# Patient Record
Sex: Female | Born: 2000
Health system: Southern US, Community
[De-identification: ages and names within clinical notes are randomized; demographics above are authoritative.]

## PROBLEM LIST (undated history)

## (undated) DIAGNOSIS — F419 Anxiety disorder, unspecified: Secondary | ICD-10-CM

## (undated) DIAGNOSIS — M940 Chondrocostal junction syndrome [Tietze]: Secondary | ICD-10-CM

## (undated) HISTORY — DX: Anxiety disorder, unspecified: F41.9

---
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
## 2021-02-08 ENCOUNTER — Telehealth (INDEPENDENT_AMBULATORY_CARE_PROVIDER_SITE_OTHER): Payer: 59 | Admitting: Physician Assistant

## 2021-02-08 ENCOUNTER — Encounter: Payer: Self-pay | Admitting: Physician Assistant

## 2021-02-08 VITALS — BP 133/93 | Ht 63.0 in | Wt 115.0 lb

## 2021-02-08 DIAGNOSIS — F411 Generalized anxiety disorder: Secondary | ICD-10-CM | POA: Insufficient documentation

## 2021-02-08 DIAGNOSIS — F419 Anxiety disorder, unspecified: Secondary | ICD-10-CM | POA: Insufficient documentation

## 2021-02-08 DIAGNOSIS — F430 Acute stress reaction: Secondary | ICD-10-CM

## 2021-02-08 DIAGNOSIS — F410 Panic disorder [episodic paroxysmal anxiety] without agoraphobia: Secondary | ICD-10-CM | POA: Diagnosis not present

## 2021-02-08 DIAGNOSIS — F22 Delusional disorders: Secondary | ICD-10-CM | POA: Insufficient documentation

## 2021-02-08 DIAGNOSIS — F422 Mixed obsessional thoughts and acts: Secondary | ICD-10-CM | POA: Diagnosis not present

## 2021-02-08 MED ORDER — LORAZEPAM 0.5 MG PO TABS: 0.5000 mg | ORAL_TABLET | Freq: Two times a day (BID) | ORAL | 0 refills | Status: DC | PRN

## 2021-02-08 MED ORDER — LORAZEPAM 0.5 MG PO TABS
0.5000 mg | ORAL_TABLET | Freq: Two times a day (BID) | ORAL | 0 refills | Status: DC | PRN
Start: 2021-02-08 — End: 2021-02-08

## 2021-02-08 MED ORDER — HYDROXYZINE HCL 25 MG PO TABS: ORAL_TABLET | ORAL | 0 refills | Status: DC

## 2021-02-08 MED ORDER — BUSPIRONE HCL 5 MG PO TABS: 5.0000 mg | ORAL_TABLET | Freq: Three times a day (TID) | ORAL | 0 refills | Status: DC

## 2021-02-08 NOTE — Progress Notes (Signed)
Anxiety/OCD - has symptoms but not diagnosed  Not hardly sleeping since last Tuesday, couple hours at a time First year of college, was able to go to school last week  Sunday - slept okay  Monday - pacing the floor, exhausted, obsessive thoughts, shaking Went to ER - gave ativan in IV, felt better (ER in [place]) Slept all night last night  Woke up this AM, feeling the same  Not coherent enough to perform mood screen  154/92 BP in ER last night, after calming down 124/80

## 2021-02-08 NOTE — Progress Notes (Signed)
Patient ID: Nicole Park, female   DOB: 06-03-2001, 20 y.o.   MRN: [id number] .Marland KitchenVirtual Visit via Video Note  I connected with Nicole Park on 02/08/21 at  9:50 AM EST by a video enabled telemedicine application and verified that I am speaking with the correct person using two identifiers.  Location: Patient: home Provider: clinic  .Marland KitchenParticipating in visit:  Patient: Nicole Park  Patient Mother and Father Provider: Tandy Gaw PA-C   I discussed the limitations of evaluation and management by telemedicine and the availability of in person appointments. The patient expressed understanding and agreed to proceed.  History of Present Illness: Patient is a 20 year old female who presents to the clinic via video with mother and father.  Her mother does most of the talking.  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  She has never been diagnosed with anxiety or OCD.  She has never been on any medication.  Recently the whole family had Covid and there was some isolation that took place that seem to aggravate the patient.  Since mid January and COVID she has been a little bit more anxious and on edge.  That has continued to worsen through her first year of college at [employer].  She has really high standards and will not make anything less than A.  Last Tuesday symptoms worsened even more of anxiety and she started to not sleep but a couple hours at a time.  She was able to go to school once we can complete her classes but it was difficult.  She had a fairly good weekend and Sunday was one of her better days where she was able to sleep and feel better.  Monday she woke up and she was extremely anxious.  She was pacing the floor, not able to sleep, exhausted, having obsessive thoughts that were irrational, feeling jittery.  Her mother reports that she was twisting her hair and seen the same things over and over like "I got to that, then that" " I can't  be like this".  Her mother took her to the ER in [place] where they gave her an oral Vistaril with no benefit.  The ended up giving her IV Ativan and that was the first rest she got.  She was able to sleep last night but she woke up feeling the same as yesterday but not quite as bad.  Her blood pressure in the ER last night was 154/92 but after calming down was 124/80.  Patient denies any chest pain, shortness of breath, palpitations.   .. Active Ambulatory Problems    Diagnosis Date Noted  . Neuropathy, thoracic (radicular) 07/09/2015  . Knee MCL sprain 06/10/2018  . Abdominal pain, right lower quadrant 07/15/2018  . Injury of thumb, left 12/16/2018  . Fracture of ankle, medial malleolus, right, closed 01/06/2019  . Dysuria 03/23/2020  . Panic attack due to exceptional stress 02/08/2021  . Anxiety 02/08/2021  . Paranoia (HCC) 02/08/2021  . Mixed obsessional thoughts and acts 02/08/2021   Resolved Ambulatory Problems    Diagnosis Date Noted  . No Resolved Ambulatory Problems   Past Medical History:  Diagnosis Date  . Slipped rib syndrome    Reviewed med, allergy, problem list.     Observations/Objective: Pt appears in distress She is pacing around the room and twirling her hair She looks like she is in panic  .Marland Kitchen Today's Vitals   02/08/21 0936  BP: (!) 133/93  Weight: 115 lb (52.2  kg)  Height: 5\' 3"  (1.6 m)   Body mass index is 20.37 kg/m.  Pt states she cannot do PHQ/GAD questionnaire because she cannot focus on the questions.    Assessment and Plan: Marland KitchenTaiz was seen today for anxiety.  Diagnoses and all orders for this visit:  Panic attack due to exceptional stress -     busPIRone (BUSPAR) 5 MG tablet; Take 1 tablet (5 mg total) by mouth 3 (three) times daily. -     hydrOXYzine (ATARAX/VISTARIL) 25 MG tablet; Take 1-2 tablets as needed up to three times day for panic attack. -     LORazepam (ATIVAN) 0.5 MG tablet; Take 1 tablet (0.5 mg total) by mouth every 12  (twelve) hours as needed for anxiety. No more than twice a day only as needed.  Anxiety -     busPIRone (BUSPAR) 5 MG tablet; Take 1 tablet (5 mg total) by mouth 3 (three) times daily. -     hydrOXYzine (ATARAX/VISTARIL) 25 MG tablet; Take 1-2 tablets as needed up to three times day for panic attack. -     LORazepam (ATIVAN) 0.5 MG tablet; Take 1 tablet (0.5 mg total) by mouth every 12 (twelve) hours as needed for anxiety. No more than twice a day only as needed.  Paranoia (HCC) -     busPIRone (BUSPAR) 5 MG tablet; Take 1 tablet (5 mg total) by mouth 3 (three) times daily. -     hydrOXYzine (ATARAX/VISTARIL) 25 MG tablet; Take 1-2 tablets as needed up to three times day for panic attack. -     LORazepam (ATIVAN) 0.5 MG tablet; Take 1 tablet (0.5 mg total) by mouth every 12 (twelve) hours as needed for anxiety. No more than twice a day only as needed.  Mixed obsessional thoughts and acts -     busPIRone (BUSPAR) 5 MG tablet; Take 1 tablet (5 mg total) by mouth 3 (three) times daily. -     hydrOXYzine (ATARAX/VISTARIL) 25 MG tablet; Take 1-2 tablets as needed up to three times day for panic attack. -     LORazepam (ATIVAN) 0.5 MG tablet; Take 1 tablet (0.5 mg total) by mouth every 12 (twelve) hours as needed for anxiety. No more than twice a day only as needed.  Other orders -     Discontinue: LORazepam (ATIVAN) 0.5 MG tablet; Take 1 tablet (0.5 mg total) by mouth every 12 (twelve) hours as needed for anxiety. No more than twice a day only as needed.   Patient was unable to complete the PHQ-9 and GAD-7 because she was not able to focus on the questions.  She does appear to be in acute panic.  I am not able to review her ED notes from Monday night.  [facility] is not in epic.  Unclear what the exact stressor is however patient has been acutely worsening since her whole family had Covid in January as well as this being her first year of college at [employer].  She has never been on medication  before.  Last night she was given Ativan and that seems to be the only thing that gave her any relief.  Discussed I would give her some Ativan however wanted her to know how this is not permanent treatment.  We need to find things that can control her anxiety other than benzodiazepines.  I do not think patient can focus on school at this point.  Wrote her out of school for 1 week.  I did add BuSpar  3 times a day and Atarax as needed.  Discussed to try those 2 things first and then Ativan if nothing else works.  Mother is trying to get patient into some counseling which I think is an excellent idea.  Discussed daily walking, deep breathing, meditation and yoga.  Patient denied any hallucinations, suicidal thoughts, thoughts of self-harm or harming others.  Follow up in 1 week.    Follow Up Instructions:    I discussed the assessment and treatment plan with the patient. The patient was provided an opportunity to ask questions and all were answered. The patient agreed with the plan and demonstrated an understanding of the instructions.   The patient was advised to call back or seek an in-person evaluation if the symptoms worsen or if the condition fails to improve as anticipated.  Spent 30 minutes with patient and family discussing symptoms, plans, medication.    Tandy Gaw, PA-C

## 2021-02-09 ENCOUNTER — Encounter: Payer: Self-pay | Admitting: Physician Assistant

## 2021-02-09 DIAGNOSIS — F22 Delusional disorders: Secondary | ICD-10-CM

## 2021-02-09 DIAGNOSIS — F410 Panic disorder [episodic paroxysmal anxiety] without agoraphobia: Secondary | ICD-10-CM

## 2021-02-09 DIAGNOSIS — F419 Anxiety disorder, unspecified: Secondary | ICD-10-CM

## 2021-02-10 ENCOUNTER — Encounter: Payer: Self-pay | Admitting: Physician Assistant

## 2021-02-10 DIAGNOSIS — F410 Panic disorder [episodic paroxysmal anxiety] without agoraphobia: Secondary | ICD-10-CM

## 2021-02-11 NOTE — Telephone Encounter (Signed)
FYI referral

## 2021-02-11 NOTE — Telephone Encounter (Signed)
Ok for note: please allow student to withdrawal from classes this semester due to medical reasons.

## 2021-02-14 NOTE — Telephone Encounter (Signed)
She was given Ativan, not sure what kind of sedative she is wanting, I'm sure this will need to wait til appt tomorrow but she also left a vm.

## 2021-02-15 ENCOUNTER — Encounter: Payer: Self-pay | Admitting: Physician Assistant

## 2021-02-15 ENCOUNTER — Telehealth: Payer: 59 | Admitting: Physician Assistant

## 2021-02-15 VITALS — BP 117/80 | HR 102 | Ht 63.0 in | Wt 114.0 lb

## 2021-02-15 DIAGNOSIS — F410 Panic disorder [episodic paroxysmal anxiety] without agoraphobia: Secondary | ICD-10-CM | POA: Diagnosis not present

## 2021-02-15 DIAGNOSIS — F22 Delusional disorders: Secondary | ICD-10-CM | POA: Diagnosis not present

## 2021-02-15 DIAGNOSIS — F422 Mixed obsessional thoughts and acts: Secondary | ICD-10-CM | POA: Diagnosis not present

## 2021-02-15 DIAGNOSIS — F430 Acute stress reaction: Secondary | ICD-10-CM

## 2021-02-15 DIAGNOSIS — F419 Anxiety disorder, unspecified: Secondary | ICD-10-CM | POA: Diagnosis not present

## 2021-02-15 MED ORDER — QUETIAPINE FUMARATE 50 MG PO TABS: ORAL_TABLET | ORAL | 0 refills | Status: DC

## 2021-02-15 NOTE — Telephone Encounter (Signed)
Thank yall for working with her Lesly Rubenstein squared, probably needs an SSRI on board, and potentially increase in Seroquel, I am happy to see her as well if there are any delays in getting her in with psychiatry.

## 2021-02-15 NOTE — Progress Notes (Signed)
Patient refused mood screen Not sleeping They increased Buspar to 2 tablets TID

## 2021-02-15 NOTE — Progress Notes (Signed)
Patient ID: Nicole Park, female   DOB: 08-24-2001, 20 y.o.   MRN: [id number] .Marland KitchenVirtual Visit via Video Note  I connected with Nicole Park on 02/15/21 at  8:50 AM EST by a video enabled telemedicine application and verified that I am speaking with the correct person using two identifiers.  Location: Patient: home Provider: clinic  .Marland KitchenParticipating in visit:  Patient: Nicole Park Patient mother and patient father who give most of the history. Provider: Tandy Gaw PA-C   I discussed the limitations of evaluation and management by telemedicine and the availability of in person appointments. The patient expressed understanding and agreed to proceed.  History of Present Illness: Patient is a 20 year old female with no diagnosed mental health history who presents to the clinic 1 week following an mental health crisis where she went to the emergency room and [facility] for anxiety, panic, compulsive thoughts, twitching.  We have not been able to locate those records since they are not on care everywhere.  Last week she was started on Ativan, hydroxyzine, BuSpar.  She had her first counseling session on Monday.  She is scheduled with a psychiatrist next Monday.  She has had little to no benefit in treatment.  The Ativan does help her the most and allows her to rest.  She did increase her BuSpar to 10 mg 3 times a day.  She denies any suicidal or homicidal thoughts.  She is having very obsessive sexual thoughts.  Mother and father support these or not like anything she ever thought about or was like.  She has this urge to touch herself in the vaginal area.  She does not understand why she continually thinks about this.  She tries not to make eye contact with people because the sexual urges and thoughts are so intrusive.  Patient is not sleeping except if she takes Ativan. She is not seeing things but her voices are so loud she feels like she hears her voice.   Mother and father support patient has always had  perfectionistic and compulsive behavior but mostly directed towards her schoolwork and behavior.  This is the first time she has had any behavior that she could not control.  .. Active Ambulatory Problems    Diagnosis Date Noted  . Neuropathy, thoracic (radicular) 07/09/2015  . Knee MCL sprain 06/10/2018  . Abdominal pain, right lower quadrant 07/15/2018  . Injury of thumb, left 12/16/2018  . Fracture of ankle, medial malleolus, right, closed 01/06/2019  . Dysuria 03/23/2020  . Panic attack due to exceptional stress 02/08/2021  . Anxiety 02/08/2021  . Paranoia (HCC) 02/08/2021  . Mixed obsessional thoughts and acts 02/08/2021   Resolved Ambulatory Problems    Diagnosis Date Noted  . No Resolved Ambulatory Problems   Past Medical History:  Diagnosis Date  . Slipped rib syndrome    Reviewed med, allergy, problem list.      Observations/Objective: Pt is avoidant with eyes.  She is jerky and constantly clicking this toy she has in her hand.  She talks so low you cannot understand her and mother has to interpret.  She displays flat affect.   Declines PHQ/MDQ/GAD.   Assessment and Plan: Marland KitchenMarland KitchenChaquana was seen today for mental health problem.  Diagnoses and all orders for this visit:  Paranoia (HCC) -     QUEtiapine (SEROQUEL) 50 MG tablet; Take one tablet at bedtime for 1 days then increase to one tablet at bedtime and 1/2 tablet in am and 1/2 tablet at lunch.  Mixed obsessional thoughts  and acts -     QUEtiapine (SEROQUEL) 50 MG tablet; Take one tablet at bedtime for 1 days then increase to one tablet at bedtime and 1/2 tablet in am and 1/2 tablet at lunch.  Panic attack due to exceptional stress -     QUEtiapine (SEROQUEL) 50 MG tablet; Take one tablet at bedtime for 1 days then increase to one tablet at bedtime and 1/2 tablet in am and 1/2 tablet at lunch.  Anxiety -     QUEtiapine (SEROQUEL) 50 MG tablet; Take one tablet at bedtime for 1 days then increase to one tablet at  bedtime and 1/2 tablet in am and 1/2 tablet at lunch.   Unclear etiology of sudden episode of paranoid and compulsive thoughts. Continue with counseling. Seeing pyshiatrist on Monday. Start seroquel 50mg  at bedtime and 25mg  at breakfast and lunch. Continue with:  buspar 10mg  TID. Ativan up to 1mg  up to BID as needed.  Stop hydroxyzine.  Follow up after [facility] with PCP for update.   Patient does not appear to be at risk of physical harm to herself or others.  Unclear etiology of mental health crisis. She has withdrawn from classes. The family did have covid a few weeks ago but if patient had covid she had little to no symptoms. She denies any illegal drug use. No family hx of mental disorders other than anxiety.    Follow Up Instructions:    I discussed the assessment and treatment plan with the patient. The patient was provided an opportunity to ask questions and all were answered. The patient agreed with the plan and demonstrated an understanding of the instructions.   The patient was advised to call back or seek an in-person evaluation if the symptoms worsen or if the condition fails to improve as anticipated.   Spent 40 minutes discussion plan with mother, father, patient.   , PA-C

## 2021-02-15 NOTE — Assessment & Plan Note (Signed)
Noted increasing anxiety and panic, currently on BuSpar, lorazepam, Seroquel. She likely will need further evaluation for bipolar disorder. Sounds like she has an appointment with psychiatry, I had be happy to see her as well, I certainly think she needs an SSRI on board as well as Seroquel rather than just BuSpar for now.

## 2021-02-15 NOTE — Telephone Encounter (Signed)
Your welcome! I told her to follow up with you after [facility] appt on Monday to get you up to date. I just started Seroquel today because it will give her more immediate help and they were all together hesitant on starting too many medications. I agree likely needs SSRI and I was think more sertraline due to her compulsive/perfectionist history.

## 2021-02-16 ENCOUNTER — Encounter: Payer: Self-pay | Admitting: Physician Assistant

## 2021-04-16 ENCOUNTER — Other Ambulatory Visit: Payer: Self-pay | Admitting: Physician Assistant

## 2021-04-16 DIAGNOSIS — F22 Delusional disorders: Secondary | ICD-10-CM

## 2021-04-16 DIAGNOSIS — F430 Acute stress reaction: Secondary | ICD-10-CM

## 2021-04-16 DIAGNOSIS — F422 Mixed obsessional thoughts and acts: Secondary | ICD-10-CM

## 2021-04-16 DIAGNOSIS — F419 Anxiety disorder, unspecified: Secondary | ICD-10-CM

## 2021-04-19 NOTE — Telephone Encounter (Signed)
Please advise, patient was following up with [facility].

## 2021-04-19 NOTE — Telephone Encounter (Signed)
Defer to [facility] and what they are prescribing. They may have changed it.

## 2021-05-11 ENCOUNTER — Other Ambulatory Visit: Payer: Self-pay | Admitting: Physician Assistant

## 2021-05-11 DIAGNOSIS — F22 Delusional disorders: Secondary | ICD-10-CM

## 2021-05-11 DIAGNOSIS — F430 Acute stress reaction: Secondary | ICD-10-CM

## 2021-05-11 DIAGNOSIS — F410 Panic disorder [episodic paroxysmal anxiety] without agoraphobia: Secondary | ICD-10-CM

## 2021-05-11 DIAGNOSIS — F422 Mixed obsessional thoughts and acts: Secondary | ICD-10-CM

## 2021-05-11 DIAGNOSIS — F419 Anxiety disorder, unspecified: Secondary | ICD-10-CM

## 2022-02-10 ENCOUNTER — Other Ambulatory Visit: Payer: Self-pay

## 2022-02-10 ENCOUNTER — Ambulatory Visit (INDEPENDENT_AMBULATORY_CARE_PROVIDER_SITE_OTHER): Payer: 59

## 2022-02-10 ENCOUNTER — Ambulatory Visit: Payer: 59 | Admitting: Sports Medicine

## 2022-02-10 DIAGNOSIS — R053 Chronic cough: Secondary | ICD-10-CM | POA: Diagnosis not present

## 2022-02-10 DIAGNOSIS — R058 Other specified cough: Secondary | ICD-10-CM

## 2022-02-10 MED ORDER — ALBUTEROL SULFATE HFA 108 (90 BASE) MCG/ACT IN AERS: INHALATION_SPRAY | RESPIRATORY_TRACT | 11 refills | Status: DC

## 2022-02-10 MED ORDER — HYDROCODONE BIT-HOMATROP MBR 5-1.5 MG/5ML PO SOLN: 5.0000 mL | Freq: Three times a day (TID) | ORAL | 0 refills | Status: DC | PRN

## 2022-02-10 MED ORDER — BENZONATATE 200 MG PO CAPS: 200.0000 mg | ORAL_CAPSULE | Freq: Three times a day (TID) | ORAL | 0 refills | Status: DC | PRN

## 2022-02-10 NOTE — Progress Notes (Signed)
? ? ?  Procedures performed today:   ? ?None. ? ?Independent interpretation of notes and tests performed by another provider:  ? ?None. ? ?Brief History, Exam, Impression, and Recommendations:   ? ?Post-viral cough syndrome ?This is a pleasant, previously healthy 21 year old female, she has had about a 4-week history of a minimally productive cough, treated with antibiotics about a week and a half ago, azithromycin. ?Unfortunately has a lingering cough that occurs when exercising, as well as when laying flat at night. ?She feels well overall. ?Exam is benign, lungs are clear. ?This is likely a postviral cough syndrome, we discussed the pathophysiology, adding albuterol for use before exercise, Tessalon Perles and Hycodan. ?Chest x-ray to complete the work-up. ?Return to see me in 4 weeks as needed. ? ? ? ?___________________________________________ ?Gwen Her. Dianah Field, M.D., ABFM., CAQSM. ?Primary Care and Sports Medicine ?[place] ? ?Adjunct Instructor of Family Medicine  ?[employer] ?

## 2022-02-10 NOTE — Assessment & Plan Note (Signed)
This is a pleasant, previously healthy 21 year old female, she has had about a 4-week history of a minimally productive cough, treated with antibiotics about a week and a half ago, azithromycin. ?Unfortunately has a lingering cough that occurs when exercising, as well as when laying flat at night. ?She feels well overall. ?Exam is benign, lungs are clear. ?This is likely a postviral cough syndrome, we discussed the pathophysiology, adding albuterol for use before exercise, Tessalon Perles and Hycodan. ?Chest x-ray to complete the work-up. ?Return to see me in 4 weeks as needed. ?

## 2022-03-10 ENCOUNTER — Ambulatory Visit: Payer: 59 | Admitting: Sports Medicine

## 2024-05-21 ENCOUNTER — Encounter: Payer: Self-pay | Admitting: Sports Medicine

## 2024-05-21 ENCOUNTER — Ambulatory Visit: Admitting: Sports Medicine

## 2024-05-21 VITALS — BP 131/88 | HR 73 | Temp 97.8°F | Wt 146.0 lb

## 2024-05-21 DIAGNOSIS — R1011 Right upper quadrant pain: Secondary | ICD-10-CM | POA: Diagnosis not present

## 2024-05-21 MED ORDER — PANTOPRAZOLE SODIUM 40 MG PO TBEC: 40.0000 mg | DELAYED_RELEASE_TABLET | Freq: Every day | ORAL | 3 refills | Status: DC

## 2024-05-21 NOTE — Progress Notes (Signed)
    Procedures performed today:    None.  Independent interpretation of notes and tests performed by another provider:   None.  Brief History, Exam, Impression, and Recommendations:    Abdominal pain, right upper quadrant Very pleasant 23 year old female, she recalls going to the beach, she does not typically eat seafood but she ate some on her trip, soon after she developed abdominal pain, right upper quadrant, crampy, with watery diarrhea resulting in an episode of fecal incontinence, she also endorses some dark and tarry stools. She was seen in urgent care, CBC, CMP were normal, stool cultures did come back positive for enteropathogenic E. coli, enterohemorrhagic and enterotoxigenic E. coli were negative. All other stool cultures were negative. She has been prescribed antibiotics by the urgent care, she has not yet started them. She does continue to report pain in the right upper quadrant, on exam she does have some discomfort in this region, she has no guarding, rigidity, no rebound tenderness, she does have a positive Murphy sign. For this reason we will go and get an ultrasound of her right upper quadrant. We will also go ahead and start pantoprazole 40 mg daily, I like to see her back in 2 or 3 weeks for further evaluation, differential does include biliary colic, peptic ulcer disease, and of course the confirmed infectious colitis.    ____________________________________________ Nicole Park. Sandy Crumb, M.D., ABFM., CAQSM., AME. Primary Care and Sports Medicine [facility]  Adjunct Professor of [facility] Medicine  [employer]  Restaurant manager, fast food

## 2024-05-21 NOTE — Assessment & Plan Note (Signed)
 Very pleasant 23 year old female, she recalls going to the beach, she does not typically eat seafood but she ate some on her trip, soon after she developed abdominal pain, right upper quadrant, crampy, with watery diarrhea resulting in an episode of fecal incontinence, she also endorses some dark and tarry stools. She was seen in urgent care, CBC, CMP were normal, stool cultures did come back positive for enteropathogenic E. coli, enterohemorrhagic and enterotoxigenic E. coli were negative. All other stool cultures were negative. She has been prescribed antibiotics by the urgent care, she has not yet started them. She does continue to report pain in the right upper quadrant, on exam she does have some discomfort in this region, she has no guarding, rigidity, no rebound tenderness, she does have a positive Murphy sign. For this reason we will go and get an ultrasound of her right upper quadrant. We will also go ahead and start pantoprazole 40 mg daily, I like to see her back in 2 or 3 weeks for further evaluation, differential does include biliary colic, peptic ulcer disease, and of course the confirmed infectious colitis.

## 2024-05-24 ENCOUNTER — Ambulatory Visit (HOSPITAL_BASED_OUTPATIENT_CLINIC_OR_DEPARTMENT_OTHER)
Admission: RE | Admit: 2024-05-24 | Discharge: 2024-05-24 | Disposition: A | Discharge status: Home / Self Care | Source: Ambulatory Visit | Attending: Sports Medicine | Admitting: Sports Medicine

## 2024-05-24 DIAGNOSIS — R1011 Right upper quadrant pain: Secondary | ICD-10-CM | POA: Insufficient documentation

## 2024-05-26 ENCOUNTER — Ambulatory Visit: Payer: Self-pay | Admitting: Sports Medicine

## 2024-06-11 ENCOUNTER — Other Ambulatory Visit: Payer: Self-pay | Admitting: Sports Medicine

## 2024-06-11 ENCOUNTER — Telehealth: Payer: Self-pay

## 2024-06-11 ENCOUNTER — Ambulatory Visit (INDEPENDENT_AMBULATORY_CARE_PROVIDER_SITE_OTHER): Admitting: Sports Medicine

## 2024-06-11 ENCOUNTER — Other Ambulatory Visit (HOSPITAL_COMMUNITY): Payer: Self-pay

## 2024-06-11 VITALS — BP 130/78 | HR 82 | Ht 63.0 in | Wt 146.0 lb

## 2024-06-11 DIAGNOSIS — Z0000 Encounter for general adult medical examination without abnormal findings: Secondary | ICD-10-CM | POA: Diagnosis not present

## 2024-06-11 DIAGNOSIS — F411 Generalized anxiety disorder: Secondary | ICD-10-CM

## 2024-06-11 DIAGNOSIS — Z23 Encounter for immunization: Secondary | ICD-10-CM

## 2024-06-11 MED ORDER — SERTRALINE HCL 25 MG PO TABS: ORAL_TABLET | ORAL | 0 refills | Status: DC

## 2024-06-11 NOTE — Assessment & Plan Note (Addendum)
 Fasting annual physical. Tdap today. She would like to talk to her parents about HPV. Referral to [facility] for cervical cancer screening, she is 23 now. Routine labs today. Did have E. coli colitis, all symptoms have resolved.

## 2024-06-11 NOTE — Addendum Note (Signed)
 Addended by: OLEY ROBIN L on: 06/11/2024 11:02 AM   Modules accepted: Orders

## 2024-06-11 NOTE — Assessment & Plan Note (Signed)
 Doing really well on sertraline, care would like to try to come off of medication, I think this is appropriate as long as we have close follow-up with the understanding that we can restart it should symptoms return. She would like to do a very slow down taper, we will do 2 weeks on each dose until off of it and I would like to see her back in about 3 months for repeat PHQ/GAD. This will give her 6 weeks off of medication to adjust. She contracts for safety.

## 2024-06-11 NOTE — Telephone Encounter (Signed)
 Pt is tapering down off of 100mg  dose. With the 25mg  tabs, she can taper down through 75, 50, and 25mg  doses.

## 2024-06-11 NOTE — Telephone Encounter (Signed)
 If the order was changed to: Sertraline 50mg  - take 1 and 1/2 tab (75mg ) daily x14 days, 1 tab daily(50mg ) x14 days, 1/2 tab daily (25mg ) x14 #42  Per test claim it would be covered and the patient would not have to wait on a PA.

## 2024-06-11 NOTE — Progress Notes (Signed)
 Subjective:    CC: Annual Physical Exam  HPI:  This patient is here for their annual physical  I reviewed the past medical history, family history, social history, surgical history, and allergies today and no changes were needed.  Please see the problem list section below in epic for further details.  Past Medical History: Past Medical History:  Diagnosis Date   Anxiety    Slipped rib syndrome    Past Surgical History: No past surgical history on file. Social History: Social History   Socioeconomic History   Marital status: Single    Spouse name: Not on file   Number of children: Not on file   Years of education: Not on file   Highest education level: Associate degree: academic program  Occupational History   Not on file  Tobacco Use   Smoking status: Never   Smokeless tobacco: Never  Vaping Use   Vaping status: Never Used  Substance and Sexual Activity   Alcohol use: Yes    Alcohol/week: 1.0 standard drink of alcohol    Types: 1 Cans of beer per week   Drug use: No   Sexual activity: Never  Other Topics Concern   Not on file  Social History Narrative   Not on file   Social Drivers of Health   Financial Resource Strain: Low Risk  (06/11/2024)   Overall Financial Resource Strain (CARDIA)    Difficulty of Paying Living Expenses: Not hard at all  Food Insecurity: No Food Insecurity (06/11/2024)   Hunger Vital Sign    Worried About Running Out of Food in the Last Year: Never true    Ran Out of Food in the Last Year: Never true  Transportation Needs: No Transportation Needs (06/11/2024)   PRAPARE - Administrator, Civil Service (Medical): No    Lack of Transportation (Non-Medical): No  Physical Activity: Insufficiently Active (06/11/2024)   Exercise Vital Sign    Days of Exercise per Week: 3 days    Minutes of Exercise per Session: 20 min  Stress: Stress Concern Present (06/11/2024)   [employer] of Occupational Health - Occupational Stress  Questionnaire    Feeling of Stress: To some extent  Social Connections: Moderately Integrated (06/11/2024)   Social Connection and Isolation Panel    Frequency of Communication with Friends and Family: More than three times a week    Frequency of Social Gatherings with Friends and Family: More than three times a week    Attends Religious Services: More than 4 times per year    Active Member of [employer] or Organizations: Yes    Attends Engineer, structural: More than 4 times per year    Marital Status: Never married   Family History: Family History  Problem Relation Age of Onset   Migraines Mother    Anxiety disorder Mother    Anxiety disorder Maternal Grandmother    Depression Other    Allergies: No Known Allergies Medications: See med rec.  Review of Systems: No headache, visual changes, nausea, vomiting, diarrhea, constipation, dizziness, abdominal pain, skin rash, fevers, chills, night sweats, swollen lymph nodes, weight loss, chest pain, body aches, joint swelling, muscle aches, shortness of breath, mood changes, visual or auditory hallucinations.  Objective:    General: Well Developed, well nourished, and in no acute distress.  Neuro: Alert and oriented x3, extra-ocular muscles intact, sensation grossly intact. Cranial nerves II through XII are intact, motor, sensory, and coordinative functions are all intact. HEENT: Normocephalic, atraumatic, pupils  equal round reactive to light, neck supple, no masses, no lymphadenopathy, thyroid nonpalpable. Oropharynx, nasopharynx, external ear canals are unremarkable. Skin: Warm and dry, no rashes noted.  Cardiac: Regular rate and rhythm, no murmurs rubs or gallops.  Respiratory: Clear to auscultation bilaterally. Not using accessory muscles, speaking in full sentences.  Abdominal: Soft, nontender, nondistended, positive bowel sounds, no masses, no organomegaly.  Musculoskeletal: Shoulder, elbow, wrist, hip, knee, ankle stable, and  with full range of motion.  Impression and Recommendations:    The patient was counselled, risk factors were discussed, anticipatory guidance given.  Annual physical exam Fasting annual physical. Tdap today. She would like to talk to her parents about HPV. Referral to [facility] for cervical cancer screening, she is 23 now. Routine labs today. Did have E. coli colitis, all symptoms have resolved.   GAD (generalized anxiety disorder) Doing really well on sertraline, care would like to try to come off of medication, I think this is appropriate as long as we have close follow-up with the understanding that we can restart it should symptoms return. She would like to do a very slow down taper, we will do 2 weeks on each dose until off of it and I would like to see her back in about 3 months for repeat PHQ/GAD. This will give her 6 weeks off of medication to adjust. She contracts for safety.   ____________________________________________ Debby PARAS. Curtis, M.D., ABFM., CAQSM., AME. Primary Care and Sports Medicine [facility]  Adjunct Professor of [employer] [facility] Medicine  [employer]  Restaurant manager, fast food

## 2024-06-11 NOTE — Telephone Encounter (Signed)
 Pharmacy Patient Advocate Encounter   Received notification from RX Request Messages that prior authorization for Sertraline 25mg  tabs is required/requested.   Insurance verification completed.   The patient is insured through [facility] .   Per test claim: Use higher strength, 1 per day.   Could the order be changed to 50mg  tabs?   I tried a test claim for the 50mg  and received a paid claim.   Please advise

## 2024-06-12 ENCOUNTER — Ambulatory Visit: Payer: Self-pay | Admitting: Sports Medicine

## 2024-06-12 ENCOUNTER — Encounter: Payer: Self-pay | Admitting: Sports Medicine

## 2024-06-12 LAB — LIPID PANEL
Chol/HDL Ratio: 3.3 ratio (ref 0.0–4.4)
Cholesterol, Total: 192 mg/dL (ref 100–199)
HDL: 58 mg/dL (ref >39)
LDL Chol Calc (NIH): 125 mg/dL — ABNORMAL HIGH (ref 0–99)
Triglycerides: 50 mg/dL (ref 0–149)
VLDL Cholesterol Cal: 9 mg/dL (ref 5–40)

## 2024-06-12 LAB — COMPREHENSIVE METABOLIC PANEL WITH GFR
ALT: 39 IU/L — ABNORMAL HIGH (ref 0–32)
AST: 28 IU/L (ref 0–40)
Albumin: 4.4 g/dL (ref 4.0–5.0)
Alkaline Phosphatase: 40 IU/L — ABNORMAL LOW (ref 44–121)
BUN/Creatinine Ratio: 20 (ref 9–23)
BUN: 14 mg/dL (ref 6–20)
Bilirubin Total: 0.8 mg/dL (ref 0.0–1.2)
CO2: 20 mmol/L (ref 20–29)
Calcium: 9.7 mg/dL (ref 8.7–10.2)
Chloride: 103 mmol/L (ref 96–106)
Creatinine, Ser: 0.7 mg/dL (ref 0.57–1.00)
Globulin, Total: 2.8 g/dL (ref 1.5–4.5)
Glucose: 90 mg/dL (ref 70–99)
Potassium: 4.6 mmol/L (ref 3.5–5.2)
Sodium: 140 mmol/L (ref 134–144)
Total Protein: 7.2 g/dL (ref 6.0–8.5)
eGFR: 125 mL/min/{1.73_m2} (ref >59)

## 2024-06-12 LAB — CBC
Hematocrit: 46.5 % (ref 34.0–46.6)
Hemoglobin: 14.8 g/dL (ref 11.1–15.9)
MCH: 27.9 pg (ref 26.6–33.0)
MCHC: 31.8 g/dL (ref 31.5–35.7)
MCV: 88 fL (ref 79–97)
Platelets: 295 10*3/uL (ref 150–450)
RBC: 5.3 x10E6/uL — ABNORMAL HIGH (ref 3.77–5.28)
RDW: 12.9 % (ref 11.7–15.4)
WBC: 5.1 10*3/uL (ref 3.4–10.8)

## 2024-06-12 LAB — HEMOGLOBIN A1C
Est. average glucose Bld gHb Est-mCnc: 97 mg/dL
Hgb A1c MFr Bld: 5 % (ref 4.8–5.6)

## 2024-06-12 LAB — TSH: TSH: 1.22 u[IU]/mL (ref 0.450–4.500)

## 2024-06-12 NOTE — Telephone Encounter (Signed)
 Should be quite cheap with a good Rx coupon

## 2024-06-16 NOTE — Telephone Encounter (Signed)
 Pt aware of the good rx cost and verbalized understanding.

## 2024-06-16 NOTE — Telephone Encounter (Signed)
 Spoke with patient regarding GoodRx. She verbalized understanding.

## 2024-06-27 ENCOUNTER — Encounter: Payer: Self-pay | Admitting: Sports Medicine

## 2024-06-27 ENCOUNTER — Other Ambulatory Visit: Payer: Self-pay

## 2024-07-02 ENCOUNTER — Telehealth: Payer: Self-pay

## 2024-07-02 ENCOUNTER — Other Ambulatory Visit (HOSPITAL_COMMUNITY): Payer: Self-pay

## 2024-07-02 DIAGNOSIS — F411 Generalized anxiety disorder: Secondary | ICD-10-CM

## 2024-07-02 NOTE — Telephone Encounter (Signed)
 Pharmacy Patient Advocate Encounter   Received notification from Patient Pharmacy that prior authorization for Sertraline  25mg  tabs is required/requested.   Insurance verification completed.   The patient is insured through [facility] .   Per test claim: The patient's insurance will only pay for 1 tablet per day.   Per last telephone encounter, if the order was changed to the Sertraline  50mg  tabs, her insurance would cover it at a $0 copay. The medicine is scored so it would be easy to break in half.

## 2024-07-03 MED ORDER — SERTRALINE HCL 50 MG PO TABS: ORAL_TABLET | ORAL | 0 refills | Status: AC

## 2024-07-03 NOTE — Addendum Note (Signed)
 Addended by: CURTIS DEBBY PARAS on: 07/03/2024 06:58 AM   Modules accepted: Orders

## 2024-07-03 NOTE — Telephone Encounter (Signed)
 Attempted to contact patient regarding update. Unfortunately I was sent to voicemail. I have left a voice message requesting a call back to our office.

## 2024-07-03 NOTE — Telephone Encounter (Signed)
 I have sent it again with the new parameters for a 50 mg tablet.

## 2024-07-11 NOTE — Telephone Encounter (Signed)
 Patient picked up prescription on 07/04/2024

## 2024-07-24 ENCOUNTER — Ambulatory Visit: Admitting: Medical-Surgical

## 2024-08-05 ENCOUNTER — Ambulatory Visit: Admitting: Medical-Surgical

## 2024-08-12 ENCOUNTER — Encounter: Payer: Self-pay | Admitting: Sports Medicine

## 2024-08-19 ENCOUNTER — Ambulatory Visit: Admitting: Medical-Surgical

## 2024-09-11 ENCOUNTER — Ambulatory Visit: Admitting: Sports Medicine

## 2024-12-14 ENCOUNTER — Telehealth: Admitting: Nurse Practitioner

## 2024-12-14 DIAGNOSIS — B001 Herpesviral vesicular dermatitis: Secondary | ICD-10-CM | POA: Diagnosis not present

## 2024-12-14 MED ORDER — VALACYCLOVIR HCL 1 G PO TABS: 2000.0000 mg | ORAL_TABLET | Freq: Two times a day (BID) | ORAL | 0 refills | Status: AC

## 2024-12-14 NOTE — Progress Notes (Signed)
 You likely have viral pharyngitis. I can send something for the cold sores/fever blisters. Lymph nodes can be swollen from viral illnesses or infections. Your labs do not show any indication of a true bacterial infection.   I have sent valtrex  for the cold sores.   E visit for Flu like symptoms   We are sorry that you are not feeling well.  Here is how we plan to help! Based on what you have shared with me it looks like you may have flu-like symptoms that should be watched but do not seem to indicate anti-viral treatment.  Influenza or the flu is  an infection caused by a respiratory virus. The flu virus is highly contagious and persons who did not receive their yearly flu vaccination may catch the flu from close contact.  We have anti-viral medications to treat the viruses that cause this infection. They are not a cure and only shorten the course of the infection. These prescriptions are most effective when they are given within the first 2 days of flu symptoms. Antiviral medications are indicated if you have a high risk of complications from the flu. You should  also consider an antiviral medication if you are in close contact with someone who is at risk. These medications can help patients avoid complications from the flu but have side effects that you should know.   Possible side effects from Tamiflu or oseltamivir include nausea, vomiting, diarrhea, dizziness, headaches, eye redness, sleep problems or other respiratory symptoms. You should not take Tamiflu if you have an allergy to oseltamivir or any to the ingredients in Tamiflu.   If you have a sore or scratchy throat, use a saltwater gargle-  to  teaspoon of salt dissolved in a 4-ounce to 8-ounce glass of warm water.  Gargle the solution for approximately 15-30 seconds and then spit.  It is important not to swallow the solution.  You can also use throat lozenges/cough drops and Chloraseptic spray to help with throat pain or  discomfort.  Warm or cold liquids can also be helpful in relieving throat pain.  For headache, pain or general discomfort, you can use Ibuprofen  or Tylenol  as directed.   Some authorities believe that zinc sprays or the use of Echinacea may shorten the course of your symptoms.   You are to isolate at home until you have been fever-free for at least 24 hours without a fever-reducing medication, and symptoms have been steadily improving for 24 hours.  If you must be around other household members who do not have symptoms, you need to make sure that both you and the family members are masking consistently with a high-quality mask.  If you note any worsening of symptoms despite treatment, please seek an in-person evaluation ASAP. If you note any significant shortness of breath or any chest pain, please seek ED evaluation. Please do not delay care!  ANYONE WHO HAS FLU SYMPTOMS SHOULD: Stay home. The flu is highly contagious and going out or to work exposes others! Be sure to drink plenty of fluids. Water is fine as well as fruit juices, sodas and electrolyte beverages. You may want to stay away from caffeine or alcohol. If you are nauseated, try taking small sips of liquids. How do you know if you are getting enough fluid? Your urine should be a pale yellow or almost colorless. Get rest. Taking a steamy shower or using a humidifier may help nasal congestion and ease sore throat pain. Using a saline nasal spray works  much the same way. Cough drops, hard candies and sore throat lozenges may ease your cough. Line up a caregiver. Have someone check on you regularly.  GET HELP RIGHT AWAY IF: You cannot keep down liquids or your medications. You become short of breath Your fell like you are going to pass out or loose consciousness. Your symptoms persist after you have completed your treatment plan  MAKE SURE YOU  Understand these instructions. Will watch your condition. Will get help right away if  you are not doing well or get worse.  Your e-visit answers were reviewed by a board certified advanced clinical practitioner to complete your personal care plan.  Depending on the condition, your plan could have included both over the counter or prescription medications.  If there is a problem please reply  once you have received a response from your provider.  Your safety is important to us .  If you have drug allergies check your prescription carefully.    You can use MyChart to ask questions about todays visit, request a non-urgent call back, or ask for a work or school excuse for 24 hours related to this e-Visit. If it has been greater than 24 hours you will need to follow up with your provider, or enter a new e-Visit to address those concerns.  You will get an e-mail in the next two days asking about your experience.  I hope that your e-visit has been valuable and will speed your recovery. Thank you for using e-visits.   I have spent 5 minutes in review of e-visit questionnaire, review and updating patient chart, medical decision making and response to patient.   Batul Diego W Maryam Feely, NP

## 2024-12-15 ENCOUNTER — Ambulatory Visit: Payer: Self-pay

## 2024-12-15 NOTE — Telephone Encounter (Signed)
 Patient shows scheduled tomorrow 12/16/2024 at 8:10am .

## 2024-12-15 NOTE — Telephone Encounter (Signed)
 FYI Only or Action Required?: FYI only for provider: appointment scheduled on 1/6.  Patient was last seen in primary care on 06/11/2024 by Curtis Debby PARAS, MD.  Called Nurse Triage reporting Fever.  Symptoms began several days ago.  Interventions attempted: OTC medications: tylenol , ibuprofen .  Symptoms are: gradually worsening.  Triage Disposition: See Physician Within 24 Hours  Patient/caregiver understands and will follow disposition?: Yes   Copied from CRM [id number]. Topic: Clinical - Red Word Triage >> Dec 15, 2024 11:25 AM Mercer PEDLAR wrote: Red Word that prompted transfer to Nurse Triage: painful lymph nodes, cold sores, and fever of 100.4 on 12/14/23. Has been to urgent care but symptoms are worsening. Reason for Disposition  [1] Tender node in the neck AND [2] also has a sore throat AND [3] minimal/no runny nose or cough  Answer Assessment - Initial Assessment Questions Pt with swollen painful lymph nodes starting 1/2. She was seen in [facility]. Evisit for cold sores. Lymph pain is worsening severe stabbing pain. Strep/flu negative, mono negative- they sent off for epstein barr but hasn't returned. Denies white patches on tonsils but they are red and swollen. Pain from the outside lymph nodes. Still with low grade fevers. Denies cough, headache, congestion, CP, SOB, dizziness.   Alternating tylenol  and ibuprofen . Hot tea with honey.   Appt 1/6 to assess.   1. LOCATION: Where is the swollen node located? Is the matching node on the other side of the body also swollen?      Bilateral but left worse than right  2. SIZE: How big is the node? (e.g., inches or centimeters; or compared to common objects such as pea, bean, marble, golf ball)      Visably Noticeable to family 3. ONSET: When did the swelling start?      Friday 1/2 4. NECK NODES: Is there a sore throat, runny nose or other symptoms of a cold?      Fever, fatigue, and cold sores ( has never had before) 5.  GROIN OR ARMPIT NODES: Is there a sore, scratch, cut or painful red area on that arm or leg?      denies 6. FEVER: Do you have a fever? If Yes, ask: What is it, how was it measured, and when did it start?      100.4 7. CAUSE: What do you think is causing the swollen lymph nodes?     Unsure  8. OTHER SYMPTOMS: Do you have any other symptoms? (e.g., node is tender to touch, skin redness over node, weight changes)     Tender to touch, painful to swallow 9. PREGNANCY: Is there any chance you are pregnant? When was your last menstrual period?     denies  Protocols used: Lymph Nodes - Swollen-A-AH

## 2024-12-16 ENCOUNTER — Ambulatory Visit: Admitting: Physician Assistant

## 2024-12-16 ENCOUNTER — Encounter: Payer: Self-pay | Admitting: Physician Assistant

## 2024-12-16 VITALS — BP 122/81 | Temp 98.4°F | Ht 63.0 in | Wt 155.0 lb

## 2024-12-16 DIAGNOSIS — J039 Acute tonsillitis, unspecified: Secondary | ICD-10-CM

## 2024-12-16 DIAGNOSIS — R6889 Other general symptoms and signs: Secondary | ICD-10-CM

## 2024-12-16 DIAGNOSIS — J029 Acute pharyngitis, unspecified: Secondary | ICD-10-CM

## 2024-12-16 DIAGNOSIS — R590 Localized enlarged lymph nodes: Secondary | ICD-10-CM

## 2024-12-16 LAB — POCT RAPID STREP A (OFFICE): Rapid Strep A Screen: NEGATIVE

## 2024-12-16 LAB — POC SOFIA 2 FLU + SARS ANTIGEN FIA
Influenza A, POC: NEGATIVE
Influenza B, POC: NEGATIVE
SARS Coronavirus 2 Ag: NEGATIVE

## 2024-12-16 MED ORDER — PREDNISONE 20 MG PO TABS: 20.0000 mg | ORAL_TABLET | Freq: Two times a day (BID) | ORAL | 0 refills | Status: AC

## 2024-12-16 MED ORDER — LIDOCAINE VISCOUS HCL 2 % MT SOLN: 15.0000 mL | OROMUCOSAL | 0 refills | Status: AC | PRN

## 2024-12-16 NOTE — Patient Instructions (Signed)
 Get labs for mono today.  Start prednisone  and lidocaine  mouth wash Continue tylenol  every 5-6 hours for pain relief.  Call with any changing or worsening symptoms.   Tonsillitis  Tonsillitis is an infection of the throat. Tonsils are tissues in the back of your throat. This infection causes the tonsils to become red, tender, and swollen. What are the causes? Tonsillitis is caused by germs (bacteria or a virus). This condition can also occur when pieces of food and bacteria build up around the tonsils. Tonsillitis that is caused by germs can spread from person to person. What are the signs or symptoms? A sore throat. Trouble swallowing. White patches on the tonsils. Swollen tonsils. Fever. Headache. Tiredness. Not feeling hungry. Snoring during sleep when you did not snore before. Foul-smelling, yellowish-white pieces of material that you cough up or spit out. These can cause bad breath. How is this treated? Medicines. These can be given to treat pain, swelling, or fever. They can also be given to kill bacteria. Surgery to take out the tonsils. This is done if you have very bad infections that do not go away. Follow these instructions at home: Medicines Take over-the-counter and prescription medicines only as told by your doctor. If you were prescribed an antibiotic medicine, take it as told by your doctor. Do not stop taking the antibiotic even if you start to feel better. Eating and drinking Drink enough fluid to keep your pee (urine) pale yellow. While your throat is sore, eat soft or liquid foods, such as: Soup. Sherbet. Soft, warm cereals, such as oatmeal or hot wheat cereal. Drink warm fluids. Eat frozen ice pops. General instructions Rest as much as you can, and get plenty of sleep. Rinse your mouth often with salt water. To make salt water, dissolve -1 tsp (3-6 g) of salt in 1 cup (237 mL) of warm water. Do not swallow the salt water. Wash your hands often with soap  and water for at least 20 seconds. If there is no soap and water, use hand sanitizer. Do not share cups, bottles, or other utensils until your symptoms are gone. Do not smoke or use any products that contain nicotine or tobacco. If you need help quitting, ask your doctor. Keep all follow-up visits. Contact a doctor if: You have large, tender lumps in your neck that are new. You have a fever that does not go away after 2-3 days. You have a rash. You cough up green, yellow-brown, or bloody fluid. You cannot swallow liquids or food for 24 hours. Only one of your tonsils is swollen. Get help right away if: You have any new symptoms such as: Vomiting. Very bad headache. Stiff neck. Chest pain. Trouble breathing or swallowing. You have very bad throat pain, and you also have drooling or voice changes. You have very bad pain that is not helped by medicine. You cannot fully open your mouth. You have redness, swelling, or very bad pain anywhere in your neck. Summary Tonsillitis is an infection of the throat. It causes your tonsils to be red, tender, and swollen. While your throat is sore, eat soft or liquid foods. Rinse your mouth often with salt water. Do not share cups, bottles, or other utensils until your symptoms are gone. This information is not intended to replace advice given to you by your health care provider. Make sure you discuss any questions you have with your health care provider. Document Revised: 04/20/2021 Document Reviewed: 04/21/2021 Elsevier Patient Education  2024 [employer].

## 2024-12-16 NOTE — Progress Notes (Signed)
 "  Acute Office Visit  Subjective:     Patient ID: Nicole Park, female    DOB: 08/01/2001, 24 y.o.   MRN: [id number]  Chief Complaint  Patient presents with   Sore Throat    HPI Discussed the use of AI scribe software for clinical note transcription with the patient, who gave verbal consent to proceed.  History of Present Illness Nicole Park is a 24 year old female who presents with severe sore throat and neck pain since before christmas.   Pharyngalgia and odynophagia - Severe throat pain localized more to the left side, onset January 2nd after initial cold symptoms resolved - Difficulty swallowing due to throat pain, making eating challenging since January 5th - No cough and minimal sinus drainage - Primary issue is severe throat pain and difficulty swallowing  Fever - Fever present since January 2nd, temperature not measured at home  Oral lesions - Development of blisters: one inside the mouth and three on the lip - No prior history of oral blisters  Recent illness and exposures - Initial cold symptoms around December 23rd, resolved prior to current illness - [place] recently experienced similar symptoms, including swollen tonsils and rash after antibiotic treatment  Prior evaluation and testing - Visited urgent care on January 3rd for worsening symptoms - Blood work performed; influenza and streptococcal tests negative - Rapid mononucleosis test negative.  - Symptomatic care given for patient.   Medication use - No antibiotics prescribed - Managing symptoms with over-the-counter medications - Stopped taking ibuprofen , unsure about continuing acetaminophen  - Use valtrex  for mouth sores but not helpful  Past medical history - Thinks she had Mononucleosis at age 80  Boyfriend did have similar symptoms.    ROS See HPI.      Objective:    BP 122/81   Temp 98.4 F (36.9 C) (Oral)   Ht 5' 3 (1.6 m)   Wt 155 lb (70.3 kg)   SpO2 99%   BMI 27.46 kg/m  BP  Readings from Last 3 Encounters:  12/16/24 122/81  06/11/24 130/78  05/21/24 131/88   Wt Readings from Last 3 Encounters:  12/16/24 155 lb (70.3 kg)  06/11/24 146 lb (66.2 kg)  05/21/24 146 lb (66.2 kg)    . Results for orders placed or performed in visit on 12/16/24  POC SOFIA 2 FLU + SARS ANTIGEN FIA   Collection Time: 12/16/24  8:20 AM  Result Value Ref Range   Influenza A, POC Negative Negative   Influenza B, POC Negative Negative   SARS Coronavirus 2 Ag Negative Negative  POCT rapid strep A   Collection Time: 12/16/24  8:22 AM  Result Value Ref Range   Rapid Strep A Screen Negative Negative     Physical Exam HENT:     Head: Normocephalic.     Right Ear: Tympanic membrane and ear canal normal. No drainage, swelling or tenderness. No middle ear effusion. Tympanic membrane is not erythematous.     Left Ear: Tympanic membrane and ear canal normal. No drainage, swelling or tenderness.  No middle ear effusion. Tympanic membrane is not erythematous.     Nose: No congestion or rhinorrhea.     Mouth/Throat:     Mouth: Mucous membranes are moist.     Pharynx: Uvula midline. Pharyngeal swelling, posterior oropharyngeal erythema and uvula swelling present. No oropharyngeal exudate.     Tonsils: No tonsillar exudate or tonsillar abscesses. 1+ on the right. 1+ on the left.     Comments: No  signs of peri-tonsillar abscess. No exudate on tonsils but noted a few scattered pustule pockets. Also noted pustules on lower lips externally and internally.  Eyes:     Conjunctiva/sclera: Conjunctivae normal.  Cardiovascular:     Rate and Rhythm: Normal rate and regular rhythm.  Lymphadenopathy:     Cervical: Cervical adenopathy present.  Neurological:     Mental Status: She is alert.  Psychiatric:        Mood and Affect: Mood normal.          Assessment & Plan:  .Nicole Park was seen today for sore throat.  Diagnoses and all orders for this visit:  Acute tonsillitis, unspecified  etiology -     predniSONE  (DELTASONE ) 20 MG tablet; Take 1 tablet (20 mg total) by mouth 2 (two) times daily with a meal. For 5 days. -     lidocaine  (XYLOCAINE ) 2 % solution; Use as directed 15 mLs in the mouth or throat every 4 (four) hours as needed (mouth/throat pain - gargle and spit).  Flu-like symptoms -     POCT rapid strep A -     POC SOFIA 2 FLU + SARS ANTIGEN FIA -     Mononucleosis Test, Qual W/ Reflex  Sore throat -     POCT rapid strep A -     POC SOFIA 2 FLU + SARS ANTIGEN FIA -     Mononucleosis Test, Qual W/ Reflex -     predniSONE  (DELTASONE ) 20 MG tablet; Take 1 tablet (20 mg total) by mouth 2 (two) times daily with a meal. For 5 days. -     lidocaine  (XYLOCAINE ) 2 % solution; Use as directed 15 mLs in the mouth or throat every 4 (four) hours as needed (mouth/throat pain - gargle and spit).  Anterior cervical adenopathy -     Mononucleosis Test, Qual W/ Reflex -     predniSONE  (DELTASONE ) 20 MG tablet; Take 1 tablet (20 mg total) by mouth 2 (two) times daily with a meal. For 5 days. -     lidocaine  (XYLOCAINE ) 2 % solution; Use as directed 15 mLs in the mouth or throat every 4 (four) hours as needed (mouth/throat pain - gargle and spit).   Assessment & Plan Suspected infectious mononucleosis with pharyngitis/tonsilitis Negative for flu, covid, and rapid mono tests. Clinical picture suggests viral etiology, likely mononucleosis, with associated ulcers. Cervical anterior lymphadenopathy, bilaterally. No red flags today. Vitals reassuring. WBC done 12/3 normal range.  Differential includes viral pharyngitis and mononucleosis. No antibiotics indicated. Prednisone  considered to reduce inflammation and swelling. Discussed potential side effects of prednisone . - Prescribed prednisone  to reduce inflammation and swelling. - Provided mouthwash for swish and gargle to numb throat. - Ordered blood test for mono antibodies, IgG and IgM.  - Advised hydration and rest. - Instructed  to monitor for worsening symptoms, fever spikes, or unilateral lymph node enlargement. - Advised to avoid ibuprofen  while on prednisone ; use Tylenol  for pain management. - Instructed to take prednisone  with food to prevent stomach irritation. - Consider antibiotic if symptoms not improving or worsening.  - written out of work for the next 2-3 days for symptom improvement.   Follow up as needed if symptoms not improving or worsening.     Charissa Knowles, PA-C   "

## 2024-12-17 ENCOUNTER — Ambulatory Visit: Payer: Self-pay | Admitting: Physician Assistant

## 2024-12-17 LAB — MONO QUAL W/RFLX QN: Mono Qual W/Rflx Qn: NEGATIVE

## 2024-12-17 MED ORDER — AMOXICILLIN-POT CLAVULANATE 875-125 MG PO TABS: 1.0000 | ORAL_TABLET | Freq: Two times a day (BID) | ORAL | 0 refills | Status: AC

## 2024-12-17 NOTE — Progress Notes (Signed)
Negative for mono
